# Patient Record
Sex: Male | Born: 2005 | Race: White | Hispanic: No | Marital: Single | Smoking: Never smoker
Health system: Southern US, Community
[De-identification: ages and names within clinical notes are randomized; demographics above are authoritative.]

## PROBLEM LIST (undated history)

## (undated) DIAGNOSIS — F32A Depression, unspecified: Secondary | ICD-10-CM

## (undated) DIAGNOSIS — L709 Acne, unspecified: Secondary | ICD-10-CM

## (undated) HISTORY — PX: TYMPANOSTOMY TUBE PLACEMENT: SHX32

---
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
## 2025-01-02 ENCOUNTER — Ambulatory Visit: Admitting: Mental Health
